# Patient Record
Sex: Male | Born: 2000 | Race: White | Hispanic: No | Marital: Single | Smoking: Never smoker
Health system: Southern US, Community
[De-identification: ages and names within clinical notes are randomized; demographics above are authoritative.]

## PROBLEM LIST (undated history)

## (undated) DIAGNOSIS — E0500 Thyrotoxicosis with diffuse goiter without thyrotoxic crisis or storm: Secondary | ICD-10-CM

## (undated) DIAGNOSIS — J45909 Unspecified asthma, uncomplicated: Secondary | ICD-10-CM

---
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
## 2021-06-13 ENCOUNTER — Other Ambulatory Visit: Payer: Self-pay

## 2021-06-13 ENCOUNTER — Emergency Department (HOSPITAL_BASED_OUTPATIENT_CLINIC_OR_DEPARTMENT_OTHER)
Admission: EM | Admit: 2021-06-13 | Discharge: 2021-06-13 | Disposition: A | Discharge status: Home / Self Care | Payer: PPO | Attending: Emergency Medicine | Admitting: Emergency Medicine

## 2021-06-13 ENCOUNTER — Encounter (HOSPITAL_BASED_OUTPATIENT_CLINIC_OR_DEPARTMENT_OTHER): Payer: Self-pay | Admitting: Emergency Medicine

## 2021-06-13 DIAGNOSIS — J45909 Unspecified asthma, uncomplicated: Secondary | ICD-10-CM | POA: Insufficient documentation

## 2021-06-13 DIAGNOSIS — R1310 Dysphagia, unspecified: Secondary | ICD-10-CM | POA: Insufficient documentation

## 2021-06-13 DIAGNOSIS — T7840XA Allergy, unspecified, initial encounter: Secondary | ICD-10-CM

## 2021-06-13 HISTORY — DX: Thyrotoxicosis with diffuse goiter without thyrotoxic crisis or storm: E05.00

## 2021-06-13 HISTORY — DX: Unspecified asthma, uncomplicated: J45.909

## 2021-06-13 MED ORDER — FAMOTIDINE IN NACL 20-0.9 MG/50ML-% IV SOLN
20.0000 mg | Freq: Once | INTRAVENOUS | Status: AC
  Administered 2021-06-13: 20 mg via INTRAVENOUS
  Filled 2021-06-13: qty 50

## 2021-06-13 MED ORDER — DIPHENHYDRAMINE HCL 50 MG/ML IJ SOLN
25.0000 mg | INTRAMUSCULAR | Status: DC | PRN
  Administered 2021-06-13: 25 mg via INTRAVENOUS
  Filled 2021-06-13: qty 1

## 2021-06-13 MED ORDER — DEXAMETHASONE SODIUM PHOSPHATE 10 MG/ML IJ SOLN
10.0000 mg | Freq: Once | INTRAMUSCULAR | Status: AC
  Administered 2021-06-13: 10 mg via INTRAVENOUS
  Filled 2021-06-13: qty 1

## 2021-06-13 MED ORDER — SODIUM CHLORIDE 0.9 % IV SOLN: 25.0000 mg | INTRAVENOUS | Status: DC | PRN

## 2021-06-13 MED ORDER — EPINEPHRINE 0.3 MG/0.3ML IJ SOAJ
INTRAMUSCULAR | Status: AC
  Filled 2021-06-13: qty 0.3

## 2021-06-13 NOTE — Discharge Instructions (Signed)
Please follow-up with your primary care doctor, discuss allergy testing as you have planned.  Please return if you have worsening symptoms including difficulty breathing, chest pain, GI upset, hives, sense of impending doom along with another symptom in conjunction with a possible allergic reaction.

## 2021-06-13 NOTE — ED Provider Notes (Signed)
[facility] EMERGENCY DEPARTMENT Provider Note   CSN: [id number] Arrival date & time: 06/13/21  1913     History Chief Complaint  Patient presents with   Allergic Reaction    Tanner Haley is a 20 y.o. male with a past medical history significant for prior food allergies, was scheduled for allergy testing at some point in the next few weeks who presents with possible allergic reaction.  Patient reports that he felt a lump in his throat, and tightness of his throat after eating dinner earlier tonight.  Patient reports that he ate a ham and cheese sandwich, and some some chips as well as a Dr. Reino Kent.  Patient reports that he has had all of these food groups before, without any reaction.  Patient also reports that he has had some throat swelling for the last few days.  Mother accompanied patient and reports that at dinner his face was puffy and his eyes were red in addition to symptoms in his throat.  Patient denies any chest pain, shortness of breath, dizziness, lightheadedness, sense of impending doom, nausea, vomiting, hives.  Patient also endorses history of GERD.   Allergic Reaction Presenting symptoms: difficulty swallowing       Past Medical History:  Diagnosis Date   Asthma    Graves disease     There are no problems to display for this patient.   History reviewed. No pertinent surgical history.     No family history on file.  Social History   Tobacco Use   Smoking status: Never  Substance Use Topics   Alcohol use: Never   Drug use: Never    Home Medications Prior to Admission medications   Medication Sig Start Date End Date Taking? Authorizing Provider  escitalopram (LEXAPRO) 20 MG tablet Take 10 mg by mouth daily.   Yes [provider]    Allergies    Peanut oil and Red dye  Review of Systems   Review of Systems  HENT:  Positive for sore throat and trouble swallowing.   All other systems reviewed and are negative.  Physical  Exam Updated Vital Signs BP 113/67   Pulse 76   Temp 99 F (37.2 C) (Oral)   Resp 14   Ht 5\' 11"  (1.803 m)   Wt 91.6 kg   SpO2 100%   BMI 28.17 kg/m   Physical Exam Vitals and nursing note reviewed.  Constitutional:      General: He is not in acute distress.    Appearance: Normal appearance.  HENT:     Head: Normocephalic and atraumatic.     Mouth/Throat:     Pharynx: Posterior oropharyngeal erythema present.     Comments: Some redness at posterior pharynx, some minimal swelling of the posterior tonsillar pillar.  No tonsillar swelling, uvula midline.  Can clearly see down to posterior pharynx, no evidence of closure. Eyes:     General:        Right eye: No discharge.        Left eye: No discharge.  Cardiovascular:     Rate and Rhythm: Normal rate and regular rhythm.     Heart sounds: No murmur heard.   No friction rub. No gallop.  Pulmonary:     Effort: Pulmonary effort is normal.     Breath sounds: Normal breath sounds.     Comments: No stridor, no wheezing, no respiratory distress, no accessory muscle use. Abdominal:     General: Bowel sounds are normal.  Palpations: Abdomen is soft.  Skin:    General: Skin is warm and dry.     Capillary Refill: Capillary refill takes less than 2 seconds.     Comments: No hives, rash of any kind  Neurological:     Mental Status: He is alert and oriented to person, place, and time.  Psychiatric:        Mood and Affect: Mood normal.        Behavior: Behavior normal.    ED Results / Procedures / Treatments   Labs (all labs ordered are listed, but only abnormal results are displayed) Labs Reviewed - No data to display  EKG EKG Interpretation  Date/Time:  Saturday June 13 2021 19:44:08 EDT Ventricular Rate:  82 PR Interval:  139 QRS Duration: 90 QT Interval:  350 QTC Calculation: 409 R Axis:   65 Text Interpretation: Sinus rhythm Borderline T abnormalities, inferior leads No significant change since last tracing  Confirmed by Richardean Canal [phone]) on 06/13/2021 8:02:57 PM  Radiology No results found.  Procedures Procedures   Medications Ordered in ED Medications  diphenhydrAMINE (BENADRYL) injection 25 mg (25 mg Intravenous Given 06/13/21 2020)  famotidine (PEPCID) IVPB 20 mg premix (0 mg Intravenous Stopped 06/13/21 2056)  dexamethasone (DECADRON) injection 10 mg (10 mg Intravenous Given 06/13/21 2028)    ED Course  I have reviewed the triage vital signs and the nursing notes.  Pertinent labs & imaging results that were available during my care of the patient were reviewed by me and considered in my medical decision making (see chart for details).    MDM Rules/Calculators/A&P                         Patient has some erythema of the posterior pharynx, without notable swelling.  Mother reports periorbital edema and facial swelling, none is apparent on physical exam, however no baseline to compare to.  Some evidence of potential allergic reaction.  No evidence of anaphylaxis, no other body systems involved.  Benign cardiac, respiratory, abdominal, neurologic exam.  Patient given Decadron, Benadryl, Pepcid.  Epinephrine not administered at this time.  No clinical suspicion for anaphylaxis.  Patient discharged in stable condition with instruction to follow-up for allergy testing, return if symptoms worsen.  Return precautions given.  Gust in some of throat pain started couple of days ago there may be some possible of an upper respiratory infection, although patient has no symptoms of upper respiratory infection at this time.  Discussed could consider COVID, flu swab at this time, patient declines at this time. Final Clinical Impression(s) / ED Diagnoses Final diagnoses:  Allergic reaction, initial encounter    Rx / DC Orders ED Discharge Orders     None        [place] 06/13/21 2107    Charlynne Pander, MD 06/14/21 [phone]

## 2021-06-13 NOTE — ED Triage Notes (Signed)
Pt presents with mother stating he thinks he is having an allergic reaction. Pt states he feels "lump in throat" and that his throat feels tight. Pt is able to speak fluently, no stridor, no drooling. PA at bedside on arrival to treatment room. Pt reports feeling this in his throat "for a couple days" but worse today after eating [employer]. Oxygen 100% on RA. Mom states his "face is puffy and eyes are red". Pt is scheduled for allergy testing. NAD.

## 2022-08-10 ENCOUNTER — Emergency Department (HOSPITAL_BASED_OUTPATIENT_CLINIC_OR_DEPARTMENT_OTHER)
Admission: EM | Admit: 2022-08-10 | Discharge: 2022-08-10 | Disposition: A | Discharge status: Home / Self Care | Payer: No Typology Code available for payment source | Attending: Emergency Medicine | Admitting: Emergency Medicine

## 2022-08-10 ENCOUNTER — Other Ambulatory Visit: Payer: Self-pay

## 2022-08-10 DIAGNOSIS — T7840XA Allergy, unspecified, initial encounter: Secondary | ICD-10-CM | POA: Insufficient documentation

## 2022-08-10 MED ORDER — DEXAMETHASONE SODIUM PHOSPHATE 10 MG/ML IJ SOLN
10.0000 mg | Freq: Once | INTRAMUSCULAR | Status: AC
  Administered 2022-08-10: 10 mg via INTRAVENOUS
  Filled 2022-08-10: qty 1

## 2022-08-10 MED ORDER — DIPHENHYDRAMINE HCL 50 MG/ML IJ SOLN
25.0000 mg | Freq: Once | INTRAMUSCULAR | Status: AC
  Administered 2022-08-10: 25 mg via INTRAVENOUS
  Filled 2022-08-10: qty 1

## 2022-08-10 NOTE — Discharge Instructions (Signed)
You were seen in the emergency department for possible allergic reaction.  Your symptoms were monitored here and improved with medication.  Please use Benadryl 1 to 2 tablets as needed for any recurrence of symptoms.  Follow-up with your regular doctor.  Return if any worsening or concerning symptoms.

## 2022-08-10 NOTE — ED Triage Notes (Signed)
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 Pt states he felt the lump and then started to look at labels from food he was eating and saw the  mayo had both red dye and paprika in it. Pt states he is brething fine but can feel a lump in throat. Pt last reaction was in 2021 and mother states he will get worse. Pt is talking in complete sentences and does not appear to be in distress during triage.

## 2022-08-10 NOTE — ED Provider Notes (Signed)
[facility] EMERGENCY DEPARTMENT Provider Note   CSN: [id number] Arrival date & time: 08/10/22  1936     History {Add pertinent medical, surgical, social history, OB history to HPI:1} Chief Complaint  Patient presents with   Allergic Reaction    Tanner Haley is a 21 y.o. male.  He is complaining of an allergic reaction with a feeling of a lump in his throat.  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  He took Pepcid because he thought he might have a reflux symptoms.  He feels a little bit short of breath.  No difficulty swallowing or speaking.  No nausea vomiting diarrhea or rash.  The history is provided by the patient and a parent.  Allergic Reaction Presenting symptoms: no rash   Presenting symptoms comment:  Lump in throat Severity:  Moderate Duration:  1 hour Prior allergic episodes:  Food/nut allergies Context: food   Relieved by:  Nothing Worsened by:  Nothing Ineffective treatments: pepcid.      Home Medications Prior to Admission medications   Medication Sig Start Date End Date Taking? Authorizing Provider  escitalopram (LEXAPRO) 20 MG tablet Take 10 mg by mouth daily.    [provider]      Allergies    Other, Peanut oil, and Red dye    Review of Systems   Review of Systems  Constitutional:  Negative for fever.  HENT:  Positive for sore throat.   Eyes:  Negative for visual disturbance.  Respiratory:  Positive for shortness of breath. Negative for cough.   Gastrointestinal:  Negative for abdominal pain.  Skin:  Negative for rash.    Physical Exam Updated Vital Signs BP (!) 146/88 (BP Location: Right Arm)   Pulse 80   Temp 99.2 F (37.3 C) (Oral)   Resp 18   Ht 5\' 11"  (1.803 m)   Wt 86.2 kg   SpO2 100%   BMI 26.50 kg/m  Physical Exam Vitals and nursing note reviewed.  Constitutional:      General: He is not in acute distress.    Appearance: Normal  appearance. He is well-developed.  HENT:     Head: Normocephalic and atraumatic.     Nose: Nose normal.     Mouth/Throat:     Mouth: Mucous membranes are moist.     Pharynx: Oropharynx is clear.  Eyes:     Conjunctiva/sclera: Conjunctivae normal.  Neck:     Comments: No stridor Cardiovascular:     Rate and Rhythm: Normal rate and regular rhythm.     Heart sounds: No murmur heard. Pulmonary:     Effort: Pulmonary effort is normal. No respiratory distress.     Breath sounds: Normal breath sounds.  Abdominal:     Palpations: Abdomen is soft.     Tenderness: There is no abdominal tenderness. There is no guarding or rebound.  Musculoskeletal:        General: No swelling.     Cervical back: Neck supple. No tenderness.  Skin:    General: Skin is warm and dry.     Capillary Refill: Capillary refill takes less than 2 seconds.  Neurological:     General: No focal deficit present.     Mental Status: He is alert.     ED Results / Procedures / Treatments   Labs (all labs ordered are listed, but only abnormal results are displayed) Labs Reviewed - No data to  display  EKG None  Radiology No results found.  Procedures Procedures  {Document cardiac monitor, telemetry assessment procedure when appropriate:1}  Medications Ordered in ED Medications  diphenhydrAMINE (BENADRYL) injection 25 mg (has no administration in time range)  dexamethasone (DECADRON) injection 10 mg (has no administration in time range)    ED Course/ Medical Decision Making/ A&P                           Medical Decision Making Risk Prescription drug management.   This patient complains of ***; this involves an extensive number of treatment Options and is a complaint that carries with it a high risk of complications and morbidity. The differential includes ***  I ordered, reviewed and interpreted labs, which included *** I ordered medication *** and reviewed PMP when indicated. I ordered imaging  studies which included *** and I independently    visualized and interpreted imaging which showed *** Additional history obtained from *** Previous records obtained and reviewed *** I consulted *** and discussed lab and imaging findings and discussed disposition.  Cardiac monitoring reviewed, *** Social determinants considered, *** Critical Interventions: ***  After the interventions stated above, I reevaluated the patient and found *** Admission and further testing considered, ***   {Document critical care time when appropriate:1} {Document review of labs and clinical decision tools ie heart score, Chads2Vasc2 etc:1}  {Document your independent review of radiology images, and any outside records:1} {Document your discussion with family members, caretakers, and with consultants:1} {Document social determinants of health affecting pt's care:1} {Document your decision making why or why not admission, treatments were needed:1} Final Clinical Impression(s) / ED Diagnoses Final diagnoses:  None    Rx / DC Orders ED Discharge Orders     None

## 2022-08-10 NOTE — ED Notes (Signed)
Pt took Pepcid prior to arrival

## 2023-12-03 ENCOUNTER — Other Ambulatory Visit (HOSPITAL_COMMUNITY): Payer: Self-pay
